# Patient Record
Sex: Male | Born: 1960 | Hispanic: Yes | Marital: Married
Health system: Southern US, Community
[De-identification: ages and names within clinical notes are randomized; demographics above are authoritative.]

## PROBLEM LIST (undated history)

## (undated) DIAGNOSIS — J45909 Unspecified asthma, uncomplicated: Secondary | ICD-10-CM

## (undated) DIAGNOSIS — E119 Type 2 diabetes mellitus without complications: Secondary | ICD-10-CM

## (undated) DIAGNOSIS — C801 Malignant (primary) neoplasm, unspecified: Secondary | ICD-10-CM

---

## 2007-05-23 ENCOUNTER — Ambulatory Visit: Payer: Self-pay | Admitting: Internal Medicine

## 2007-06-16 ENCOUNTER — Ambulatory Visit: Payer: Self-pay | Admitting: Otolaryngology

## 2018-07-16 ENCOUNTER — Emergency Department
Admission: EM | Admit: 2018-07-16 | Discharge: 2018-07-16 | Disposition: A | Discharge status: Home / Self Care | Payer: No Typology Code available for payment source | Attending: Emergency Medicine | Admitting: Emergency Medicine

## 2018-07-16 ENCOUNTER — Emergency Department: Payer: No Typology Code available for payment source

## 2018-07-16 DIAGNOSIS — Y998 Other external cause status: Secondary | ICD-10-CM | POA: Diagnosis not present

## 2018-07-16 DIAGNOSIS — Y92410 Unspecified street and highway as the place of occurrence of the external cause: Secondary | ICD-10-CM | POA: Insufficient documentation

## 2018-07-16 DIAGNOSIS — R1011 Right upper quadrant pain: Secondary | ICD-10-CM | POA: Insufficient documentation

## 2018-07-16 DIAGNOSIS — Y939 Activity, unspecified: Secondary | ICD-10-CM | POA: Insufficient documentation

## 2018-07-16 DIAGNOSIS — S0990XA Unspecified injury of head, initial encounter: Secondary | ICD-10-CM | POA: Insufficient documentation

## 2018-07-16 HISTORY — DX: Unspecified asthma, uncomplicated: J45.909

## 2018-07-16 HISTORY — DX: Type 2 diabetes mellitus without complications: E11.9

## 2018-07-16 HISTORY — DX: Malignant (primary) neoplasm, unspecified: C80.1

## 2018-07-16 LAB — CBC
HCT: 42.6 % (ref 40.0–52.0)
Hemoglobin: 15 g/dL (ref 13.0–18.0)
MCH: 35.9 pg — ABNORMAL HIGH (ref 26.0–34.0)
MCHC: 35.1 g/dL (ref 32.0–36.0)
MCV: 102.1 fL — ABNORMAL HIGH (ref 80.0–100.0)
Platelets: 112 K/uL — ABNORMAL LOW (ref 150–440)
RBC: 4.17 MIL/uL — ABNORMAL LOW (ref 4.40–5.90)
RDW: 13.6 % (ref 11.5–14.5)
WBC: 6.2 K/uL (ref 3.8–10.6)

## 2018-07-16 LAB — COMPREHENSIVE METABOLIC PANEL
ALBUMIN: 3.8 g/dL (ref 3.5–5.0)
ALK PHOS: 189 U/L — AB (ref 38–126)
ALT: 57 U/L — AB (ref 0–44)
AST: 59 U/L — ABNORMAL HIGH (ref 15–41)
Anion gap: 8 (ref 5–15)
BILIRUBIN TOTAL: 0.9 mg/dL (ref 0.3–1.2)
BUN: 15 mg/dL (ref 6–20)
CO2: 22 mmol/L (ref 22–32)
CREATININE: 0.6 mg/dL — AB (ref 0.61–1.24)
Calcium: 9.1 mg/dL (ref 8.9–10.3)
Chloride: 109 mmol/L (ref 98–111)
GFR calc Af Amer: >60 mL/min (ref >60)
GFR calc non Af Amer: >60 mL/min (ref >60)
GLUCOSE: 216 mg/dL — AB (ref 70–99)
Potassium: 4 mmol/L (ref 3.5–5.1)
Sodium: 139 mmol/L (ref 135–145)
TOTAL PROTEIN: 7.1 g/dL (ref 6.5–8.1)

## 2018-07-16 LAB — PROTIME-INR
INR: 0.99
Prothrombin Time: 13 seconds (ref 11.4–15.2)

## 2018-07-16 MED ORDER — OXYCODONE HCL 5 MG PO TABS: 5.0000 mg | ORAL_TABLET | Freq: Three times a day (TID) | ORAL | 0 refills | Status: AC | PRN

## 2018-07-16 MED ORDER — IOPAMIDOL (ISOVUE-300) INJECTION 61%
100.0000 mL | Freq: Once | INTRAVENOUS | Status: AC | PRN
  Administered 2018-07-16: 100 mL via INTRAVENOUS
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
## 2020-02-16 ENCOUNTER — Ambulatory Visit: Payer: Medicare Other | Attending: Internal Medicine

## 2020-02-16 DIAGNOSIS — Z23 Encounter for immunization: Secondary | ICD-10-CM

## 2020-02-16 NOTE — Progress Notes (Signed)
   Covid-19 Vaccination Clinic  Name:  Roberto Cook    MRN: [id number] DOB: Feb 09, 1961  02/16/2020  Mr. Roberto Cook was observed post Covid-19 immunization for 30 minutes based on pre-vaccination screening without incident. He was provided with Vaccine Information Sheet and instruction to access the V-Safe system.   Mr. Roberto Cook was instructed to call 911 with any severe reactions post vaccine: Marland Kitchen Difficulty breathing  . Swelling of face and throat  . A fast heartbeat  . A bad rash all over body  . Dizziness and weakness   Immunizations Administered    Name Date Dose VIS Date Route   Pfizer COVID-19 Vaccine 02/16/2020  9:16 AM 0.3 mL 11/08/2019 Intramuscular   Manufacturer: [place]   Lot: [id number]   [place]: [id number]

## 2020-03-08 ENCOUNTER — Ambulatory Visit: Payer: Medicare Other | Attending: Internal Medicine

## 2020-03-08 DIAGNOSIS — Z23 Encounter for immunization: Secondary | ICD-10-CM

## 2020-03-08 NOTE — Progress Notes (Signed)
   Covid-19 Vaccination Clinic  Name:  Roberto Cook    MRN: [id number] DOB: 06-Aug-1961  03/08/2020  [name] was observed post Covid-19 immunization for 15 minutes without incident. He was provided with Vaccine Information Sheet and instruction to access the V-Safe system.   [name] was instructed to call 911 with any severe reactions post vaccine: Marland Kitchen Difficulty breathing  . Swelling of face and throat  . A fast heartbeat  . A bad rash all over body  . Dizziness and weakness   Immunizations Administered    Name Date Dose VIS Date Route   Pfizer COVID-19 Vaccine 03/08/2020  9:45 AM 0.3 mL 11/08/2019 Intramuscular   Manufacturer: [place]   Lot: [id number]   [place]: [id number]

## 2020-12-29 DEATH — deceased
